# Patient Record
Sex: Female | Born: 1995 | Race: Black or African American | Hispanic: No | Marital: Single | Smoking: Never smoker
Health system: Southern US, Community
[De-identification: ages and names within clinical notes are randomized; demographics above are authoritative.]

## PROBLEM LIST (undated history)

## (undated) DIAGNOSIS — Z9289 Personal history of other medical treatment: Secondary | ICD-10-CM

## (undated) HISTORY — PX: COLONOSCOPY: SHX174

## (undated) HISTORY — PX: UPPER GI ENDOSCOPY: SHX6162

---
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
## 2016-01-16 ENCOUNTER — Encounter: Payer: Self-pay | Admitting: Internal Medicine

## 2016-01-16 ENCOUNTER — Other Ambulatory Visit (INDEPENDENT_AMBULATORY_CARE_PROVIDER_SITE_OTHER): Payer: BLUE CROSS/BLUE SHIELD

## 2016-01-16 ENCOUNTER — Ambulatory Visit (INDEPENDENT_AMBULATORY_CARE_PROVIDER_SITE_OTHER): Payer: BLUE CROSS/BLUE SHIELD | Admitting: Internal Medicine

## 2016-01-16 VITALS — BP 108/68 | HR 68 | Temp 98.4°F | Resp 12 | Ht 68.0 in | Wt 172.0 lb

## 2016-01-16 DIAGNOSIS — R946 Abnormal results of thyroid function studies: Secondary | ICD-10-CM

## 2016-01-16 DIAGNOSIS — R7989 Other specified abnormal findings of blood chemistry: Secondary | ICD-10-CM | POA: Insufficient documentation

## 2016-01-16 LAB — T4, FREE: FREE T4: 0.72 ng/dL (ref 0.60–1.60)

## 2016-01-16 LAB — T3, FREE: T3 FREE: 3.8 pg/mL (ref 2.3–4.2)

## 2016-01-16 LAB — TSH: TSH: 0.52 u[IU]/mL (ref 0.40–5.00)

## 2016-01-16 NOTE — Progress Notes (Signed)
Patient ID: Christina Andrews, female   DOB: 08-28-1996, 20 y.o.   MRN: [id number]   HPI  Christina Andrews is a 20 y.o.-year-old female, referred by Dr. Loreta Ave in consultation for a low TSH. She is here with her cousin.  I reviewed pt's thyroid tests:  12/26/2015: TSH 0.394 (0.45-4.5) No previous tests available. Patient is from [place], studying here.  Pt denies feeling nodules in neck, hoarseness, dysphagia/odynophagia, SOB with lying down; she c/o: - fatigue - + weight gain 05/2015 >> 12/2015 (training harder at the gym >> gained muscle) - + excessive sweating - no tremors - no anxiety - + palpitations - at rest - Not new - no hyperdefecation - no hair loss  Pt does not have a FH of thyroid ds. No FH of thyroid cancer. No h/o radiation tx to head or neck.  No seaweed or kelp, + recent contrast studies - 0328/2017. No steroid use. No herbal supplements. No Biotin use. No narcotics recently. No URI. No chances for pregnancy.  She also has AP and nausea >> seen by Dr Loreta Ave. She also had constipation and BRBPR and bloody emesis >> resolved.  ROS: Constitutional: no weight gain/loss, + fatigue, no subjective hyperthermia/hypothermia Eyes: no blurry vision, no xerophthalmia ENT: no sore throat, no nodules palpated in throat, no dysphagia/odynophagia, no hoarseness Cardiovascular: no CP/SOB/palpitations/leg swelling Respiratory: no cough/SOB Gastrointestinal: no N/V/D/C Musculoskeletal: no muscle/joint aches Skin: no rashes Neurological: no tremors/numbness/tingling/dizziness Psychiatric: no depression/anxiety  Past Medical History  Diagnosis Date  . Hx of HIDA scan    Past Surgical History  Procedure Laterality Date  . Upper gi endoscopy     Social History   Social History  . Marital Status: Single    Spouse Name: N/A  . Number of Children: 0   Occupational History  . student   Social History Main Topics  . Smoking status: Never Smoker   . Smokeless tobacco:  Never Used  . Alcohol Use: No  . Drug Use: No   Current Outpatient Prescriptions on File Prior to Visit  Medication Sig Dispense Refill  . ibuprofen (ADVIL,MOTRIN) 200 MG tablet Take 800 mg by mouth every 6 (six) hours as needed for headache, mild pain or moderate pain.     . Multiple Vitamins-Minerals (MULTIVITAMIN GUMMIES ADULTS PO) Take 1 tablet by mouth daily.    . ondansetron (ZOFRAN) 4 MG tablet Take 1 tablet (4 mg total) by mouth every 6 (six) hours. 12 tablet 0  . oxyCODONE-acetaminophen (PERCOCET/ROXICET) 5-325 MG tablet TAKE 1 TO 2 TABLETS BY MOUTH 2 TO 3 TIMES DAILY FOR SEVERE PAIN  0   No current facility-administered medications on file prior to visit.   Allergies  Allergen Reactions  . Cantaloupe (Diagnostic) Hives and Swelling    Allergic response to all melons, this event was cantaloupe and honeydew   Family History  Problem Relation Age of Onset  . Hyperlipidemia Father   + see HPI  PE: BP 108/68 mmHg  Pulse 68  Temp(Src) 98.4 F (36.9 C) (Oral)  Resp 12  Ht  (1.727 m)  Wt 172 lb (78.019 kg)  BMI 26.16 kg/m2  SpO2 98% Wt Readings from Last 3 Encounters:  01/16/16 172 lb (78.019 kg) (93 %*, Z = 1.45)   * Growth percentiles are based on CDC 2-20 Years data.   Constitutional: normal weight, appears muscular, in NAD Eyes: PERRLA, EOMI, no exophthalmos, no lid lag, no stare ENT: moist mucous membranes, no thyromegaly, no thyroid bruits, no cervical lymphadenopathy  Cardiovascular: RRR, No MRG Respiratory: CTA B Gastrointestinal: abdomen soft, NT, ND, BS+ Musculoskeletal: no deformities, strength intact in all 4 Skin: moist, warm, no rashes Neurological: no tremor with outstretched hands, DTR normal in all 4  ASSESSMENT: 1.Low TSH   PLAN:  1. Patient with a recently found low TSH, without thyrotoxic sxs: no weight loss, tremors, tachycardia, insomnia, hyperdefecation, anxiety. He does have some heat intolerance and palpitations, which are not new -  she does not appear to have exogenous causes for the low TSH, except a CT scan with contrast obtained ~ 2 weeks before the lab check - We discussed that possible causes of thyrotoxicosis are:  Graves ds   Thyroiditis toxic multinodular goiter/ toxic adenoma (I cannot feel nodules at palpation of her thyroid). - I suggested that we check the TSH, fT3 and fT4 and also add thyroid stimulating antibodies to screen for Graves' disease.  - If the tests remain abnormal, we may need an uptake and scan to differentiate between the 3 above possible etiologies  - we discussed about possible modalities of treatment for the above conditions, to include methimazole use, radioactive iodine ablation or (last resort) surgery. - I do not feel that we need to add beta blockers at this time, since she is not tachycardic, anxious, or tremulous - I advised her to join my chart to communicate easier - RTC if labs return abnormal  Appointment on 01/16/2016  Component Date Value Ref Range Status  . T3, Free 01/16/2016 3.8  2.3 - 4.2 pg/mL Final  . Free T4 01/16/2016 0.72  0.60 - 1.60 ng/dL Final  . TSH 45/40/981105/09/2015 0.52  0.40 - 5.00 uIU/mL Final   TFTs normal. Possibly 2/2 high iodine load from the contrasted CT scan vs. Mild episode of thyroiditis, now resolved. TSI antibodies are still pending.

## 2016-01-16 NOTE — Patient Instructions (Addendum)
Please stop at [facility] office for labs.  If they are still abnormal, we may need an uptake and scan test.  We will schedule another appt if the labs are abnormal.

## 2016-01-19 LAB — THYROID STIMULATING IMMUNOGLOBULIN: TSI: 103 %{baseline} (ref <140)

## 2016-06-06 ENCOUNTER — Other Ambulatory Visit (HOSPITAL_COMMUNITY): Payer: Self-pay | Admitting: Orthopedic Surgery

## 2016-06-06 ENCOUNTER — Ambulatory Visit (HOSPITAL_COMMUNITY)
Admission: RE | Admit: 2016-06-06 | Discharge: 2016-06-06 | Disposition: A | Discharge status: Home / Self Care | Payer: BLUE CROSS/BLUE SHIELD | Source: Ambulatory Visit | Attending: Orthopedic Surgery | Admitting: Orthopedic Surgery

## 2016-06-06 DIAGNOSIS — M7989 Other specified soft tissue disorders: Secondary | ICD-10-CM | POA: Diagnosis not present

## 2016-06-06 DIAGNOSIS — M79644 Pain in right finger(s): Secondary | ICD-10-CM

## 2016-06-06 MED ORDER — GADOBENATE DIMEGLUMINE 529 MG/ML IV SOLN
15.0000 mL | Freq: Once | INTRAVENOUS | Status: AC | PRN
Start: 2016-06-06 — End: 2016-06-06
  Administered 2016-06-06: 15 mL via INTRAVENOUS

## 2016-06-11 ENCOUNTER — Encounter (HOSPITAL_COMMUNITY): Payer: Self-pay | Admitting: Emergency Medicine

## 2016-06-11 ENCOUNTER — Emergency Department (HOSPITAL_COMMUNITY)
Admission: EM | Admit: 2016-06-11 | Discharge: 2016-06-11 | Disposition: A | Discharge status: Home / Self Care | Payer: BLUE CROSS/BLUE SHIELD | Attending: Emergency Medicine | Admitting: Emergency Medicine

## 2016-06-11 DIAGNOSIS — F419 Anxiety disorder, unspecified: Secondary | ICD-10-CM | POA: Insufficient documentation

## 2016-06-11 DIAGNOSIS — T404X5A Adverse effect of other synthetic narcotics, initial encounter: Secondary | ICD-10-CM | POA: Diagnosis not present

## 2016-06-11 DIAGNOSIS — Z79899 Other long term (current) drug therapy: Secondary | ICD-10-CM | POA: Insufficient documentation

## 2016-06-11 DIAGNOSIS — T50905A Adverse effect of unspecified drugs, medicaments and biological substances, initial encounter: Secondary | ICD-10-CM

## 2016-06-11 MED ORDER — DIPHENHYDRAMINE HCL 25 MG PO CAPS
25.0000 mg | ORAL_CAPSULE | Freq: Once | ORAL | Status: AC
  Administered 2016-06-11: 25 mg via ORAL
  Filled 2016-06-11: qty 1

## 2016-06-11 NOTE — Discharge Instructions (Signed)
Take ibuprofen for pain as needed. STOP TAKING ULTRAM (TRAMADOL). Follow up with your doctor as per schedule. Return to the emergency department with any worsening symptoms or new concerns.

## 2016-06-11 NOTE — ED Triage Notes (Signed)
Pt from home with "jitteryness" to tramadol that she took tonight at [employer]. Pt is taking the tramadol for pain secondary to a "procedure" she had done to her right pointer finger for infection. Finger is bandaged. Pt is not febrile nor tachycardic.

## 2016-06-11 NOTE — ED Provider Notes (Signed)
[facility]-EMERGENCY DEPT Provider Note   CSN: [id number] Arrival date & time: 06/11/16  [id number]  By signing my name below, I, Christina Andrews, attest that this documentation has been prepared under the direction and in the presence of Destry Dauber A Fantasha Daniele PA-C,. Electronically Signed: Sonum Andrews, Neurosurgeoncribe. 06/11/16. 3:16 AM.  History   Chief Complaint Chief Complaint  Patient presents with  . Medication Reaction    The history is provided by the patient. No language interpreter was used.     HPI Comments: Christina Andrews is a 20 y.o. female who presents to the Emergency Department complaining of a medication reaction that occurred about 2.5 hours ago. Patient states she has been taking Tramadol for a finger procedure for the past 5 days without difficulty. She reports taking most recent dose at 12:30AM today and began feeling anxious/jumpy and diaphoretic 20 minutes later. She states this has gradually improved over the last couple of hours but complains of mild SOB. She has not taken Benadryl for her symptoms. She denies tongue swelling, lip swelling, difficulty swallowing, rash.   Past Medical History:  Diagnosis Date  . Hx of HIDA scan     Patient Active Problem List   Diagnosis Date Noted  . Low serum thyroid stimulating hormone (TSH) 01/16/2016    Past Surgical History:  Procedure Laterality Date  . COLONOSCOPY    . UPPER GI ENDOSCOPY      OB History    No data available       Home Medications    Prior to Admission medications   Medication Sig Start Date End Date Taking? Authorizing Provider  ibuprofen (ADVIL,MOTRIN) 200 MG tablet Take 800 mg by mouth every 6 (six) hours as needed for headache, mild pain or moderate pain.     Historical Provider, MD  Multiple Vitamins-Minerals (MULTIVITAMIN GUMMIES ADULTS PO) Take 1 tablet by mouth daily.    Historical Provider, MD  ondansetron (ZOFRAN) 4 MG tablet Take 1 tablet (4 mg total) by mouth every 6 (six) hours. 12/13/15   Emi HolesAlexandra M  Law, PA-C    Family History Family History  Problem Relation Age of Onset  . Hyperlipidemia Father     Social History Social History  Substance Use Topics  . Smoking status: Never Smoker  . Smokeless tobacco: Never Used  . Alcohol use No     Allergies   Cantaloupe (diagnostic)   Review of Systems Review of Systems  Constitutional: Positive for diaphoresis.  HENT: Negative for facial swelling and trouble swallowing.   Respiratory: Negative for shortness of breath.   Cardiovascular: Negative for chest pain.  Gastrointestinal: Negative for nausea.  Skin: Negative for rash.  Psychiatric/Behavioral: The patient is nervous/anxious.      Physical Exam Updated Vital Signs BP 127/80 (BP Location: Left Arm)   Pulse 79   Temp 98.3 F (36.8 C) (Oral)   Resp 16   Ht 5\' 9"  (1.753 m)   Wt 155 lb (70.3 kg)   SpO2 98%   BMI 22.89 kg/m   Physical Exam  Constitutional: She is oriented to person, place, and time. She appears well-developed and well-nourished.  HENT:  Head: Normocephalic and atraumatic.  Mouth/Throat: Oropharynx is clear and moist.  No lip or tongue swelling  Cardiovascular: Normal rate.   Pulmonary/Chest: Effort normal and breath sounds normal. No stridor. No respiratory distress. She has no wheezes. She has no rales.  Full breath sounds in all fields  Neurological: She is alert and oriented to person, place, and time.  Skin: Skin is warm and dry.  Psychiatric: She has a normal mood and affect.  Nursing note and vitals reviewed.    ED Treatments / Results  DIAGNOSTIC STUDIES: Oxygen Saturation is 98% on RA, normal by my interpretation.    COORDINATION OF CARE: 3:14 AM Discussed treatment plan with pt at bedside and pt agreed to plan.   Labs (all labs ordered are listed, but only abnormal results are displayed) Labs Reviewed - No data to display  EKG  EKG Interpretation None       Radiology No results found.  Procedures Procedures  (including critical care time)  Medications Ordered in ED Medications - No data to display   Initial Impression / Assessment and Plan / ED Course  I have reviewed the triage vital signs and the nursing notes.  Pertinent labs & imaging results that were available during my care of the patient were reviewed by me and considered in my medical decision making (see chart for details).  Clinical Course    Patient with symptoms of nervousness, "jumpy", restless, and diaphoretic 20 minutes after taking it tonight. She is feeling better now but is not symptom-free.   Do not feel there is any sign of anaphylaxis. Recommended not taking any further Tramodol and will switch to another medication. She declines other medication and will take ibuprofen. She is felt stable for discharge home.   Final Clinical Impressions(s) / ED Diagnoses   Final diagnoses:  None   1. Adverse drug reaction  New Prescriptions New Prescriptions   No medications on file    I personally performed the services described in this documentation, which was scribed in my presence. The recorded information has been reviewed and is accurate.      Elpidio Anis, PA-C 06/20/16 1610    Zadie Rhine, MD 06/20/16 [phone]

## 2017-07-22 IMAGING — NM NM HEPATO W/GB/PHARM/[PERSON_NAME]
2 series · 12 of 12 positions shown · non-contrast
Comparison: Abdominal ultrasound dated 06/17/2015

CLINICAL DATA: Abdominal pain x3 weeks, nausea/ vomiting.

EXAM:
NUCLEAR MEDICINE HEPATOBILIARY IMAGING WITH GALLBLADDER EF
TECHNIQUE: Sequential images of the abdomen were obtained [DATE] minutes
following intravenous administration of radiopharmaceutical. After
slow intravenous infusion of 1.39 micrograms Cholecystokinin,
gallbladder ejection fraction was determined.
RADIOPHARMACEUTICALS:  5.32 mCi Bc-LLm Choletec IV

[Series 1: biliary · 4.14mm/px · 6 of 46 frames shown]
[frame 4/46]
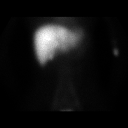
[frame 12/46]
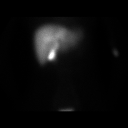
[frame 20/46]
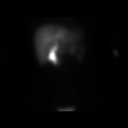
[frame 27/46]
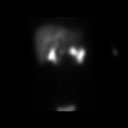
[frame 35/46]
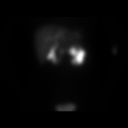
[frame 43/46]
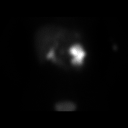

[Series 2: gbef · 4.14mm/px · 6 of 60 frames shown]
[frame 6/60]
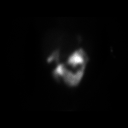
[frame 16/60]
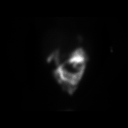
[frame 26/60]
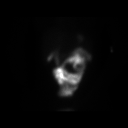
[frame 36/60]
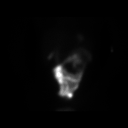
[frame 46/60]
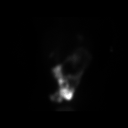
[frame 56/60]
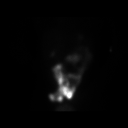

[12 of 12 positions shown; findings below may reference images not displayed]

FINDINGS: Normal hepatic excretion.

Gallbladder is visualized within 5 minutes.

Small bowel is visualized within 20 minutes.

Gallbladder ejection fraction of 97%

At 45 min, normal ejection fraction is greater than 40%.
IMPRESSION: Normal hepatobiliary nuclear medicine scan.

Normal gallbladder ejection fraction.

## 2017-09-13 IMAGING — US US ABDOMEN COMPLETE
1 series · 14 of 25 positions shown · non-contrast
Comparison: None.

CLINICAL DATA: Nausea.

EXAM:
ULTRASOUND ABDOMEN COMPLETE

[Series 1: us abdomen complete · 0.20mm/px · 14 of 67 slices shown]
[im 1/67]
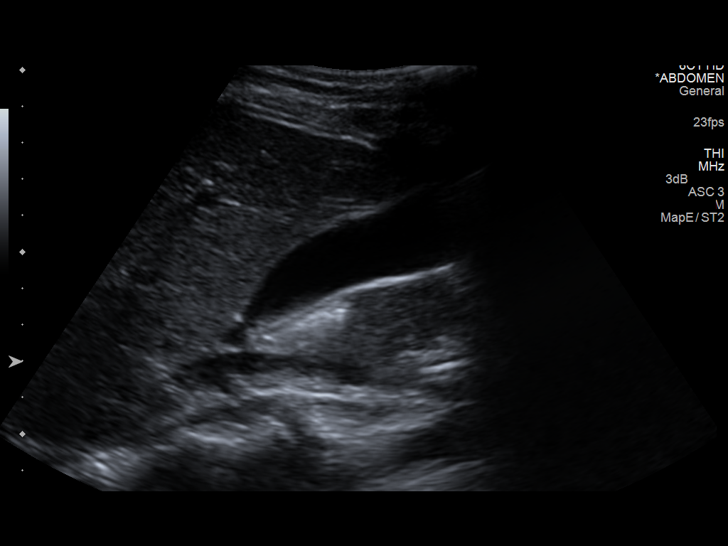
[im 6/67]
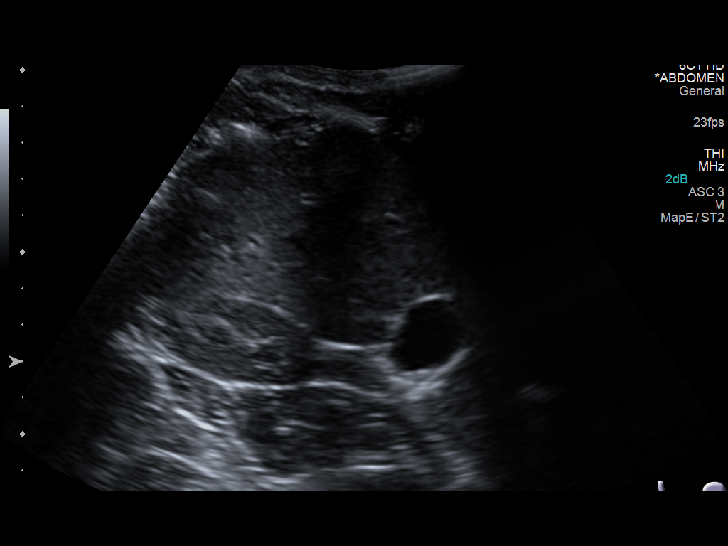
[im 12/67]
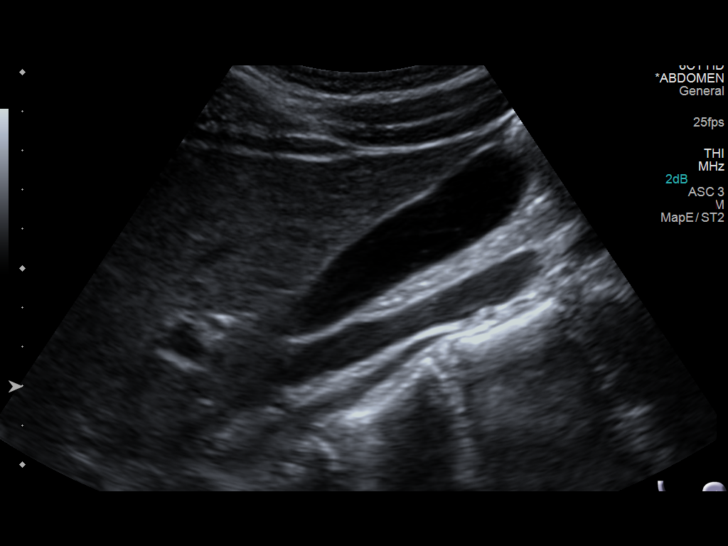
[im 17/67]
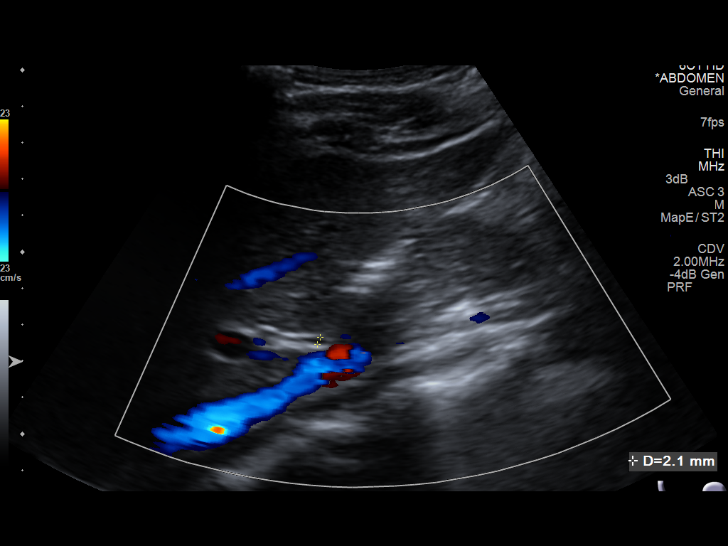
[im 23/67]
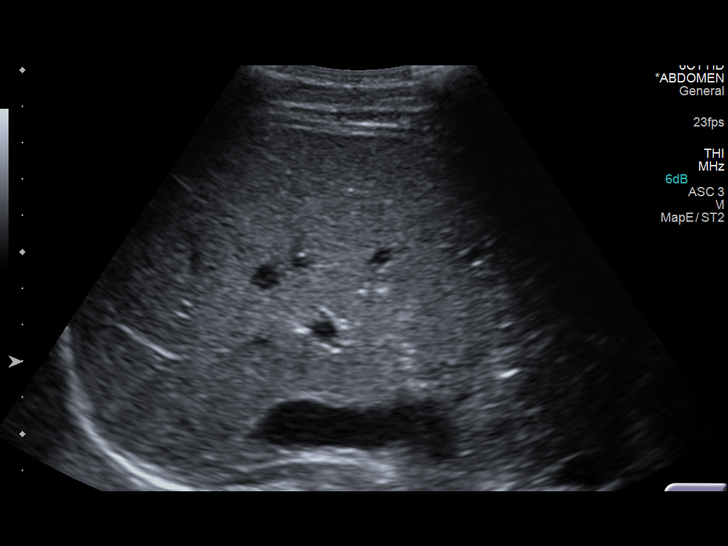
[im 25/67]
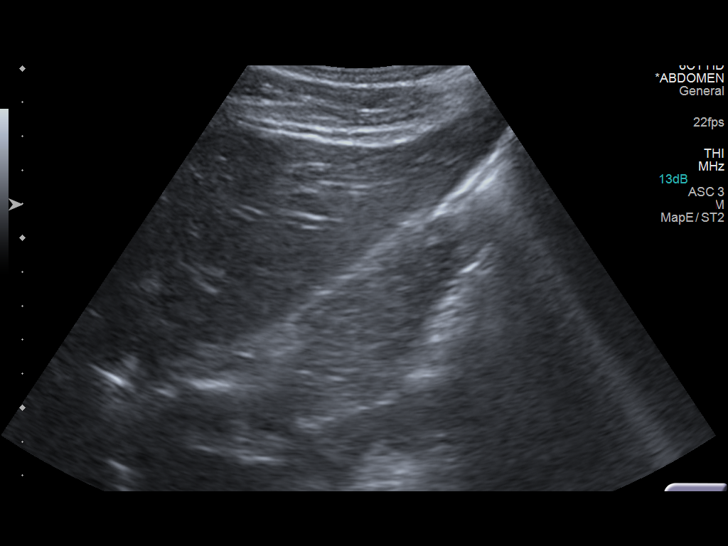
[im 31/67]
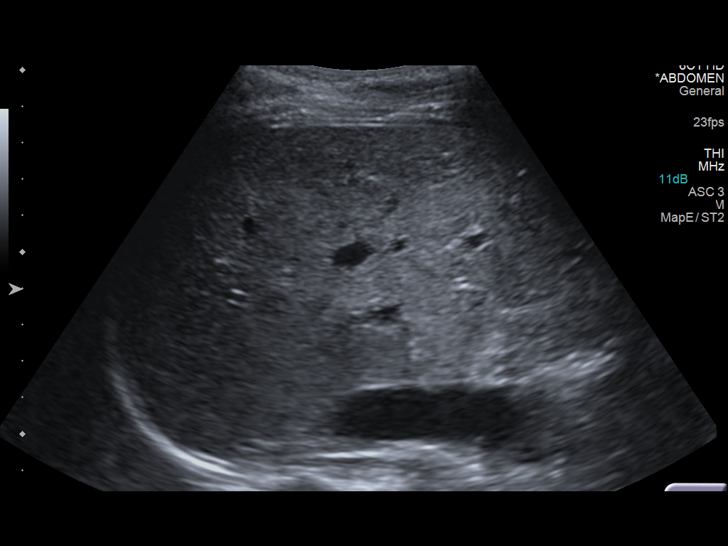
[im 36/67]
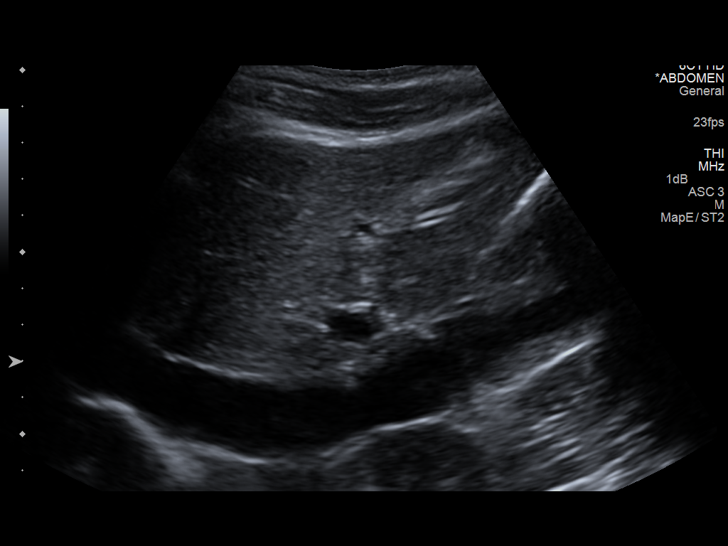
[im 42/67]
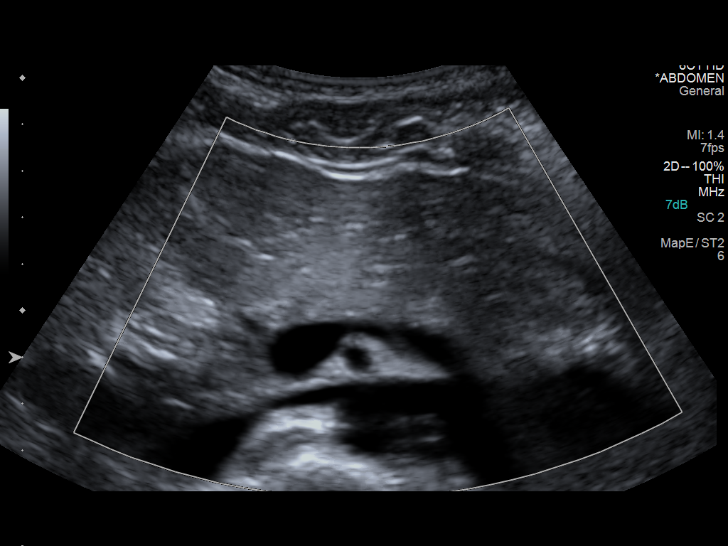
[im 45/67]
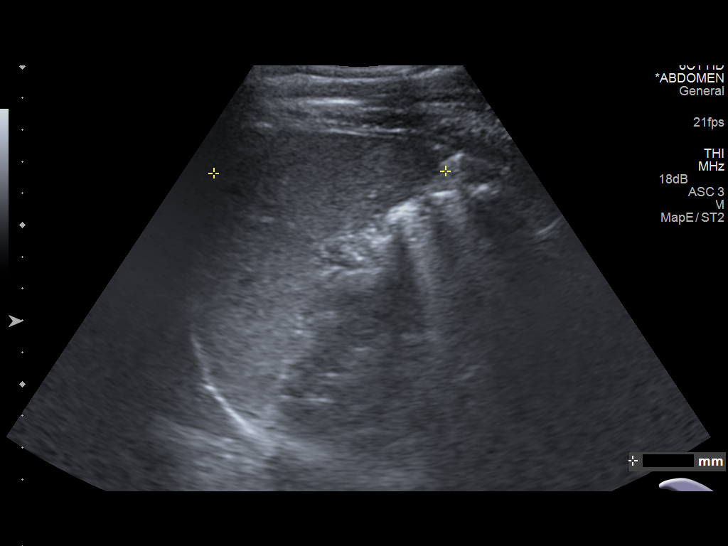
[im 50/67]
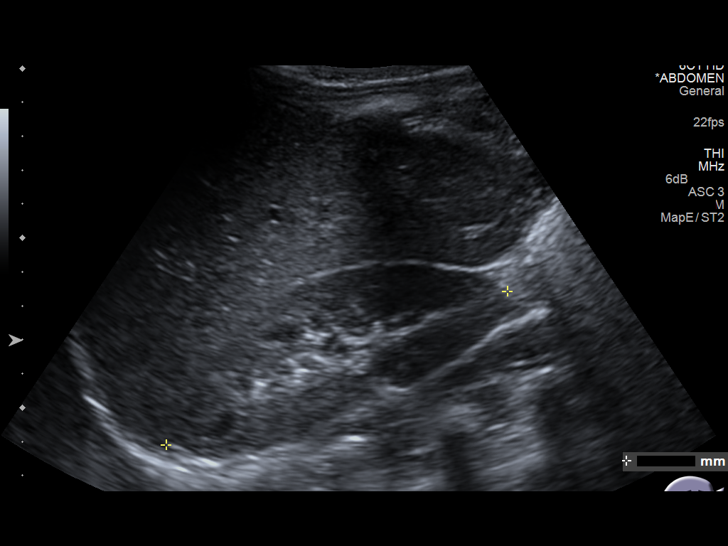
[im 56/67]
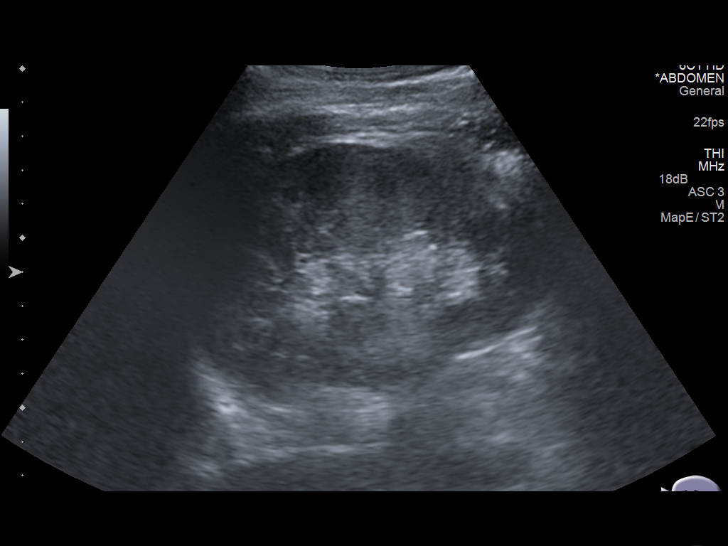
[im 61/67]
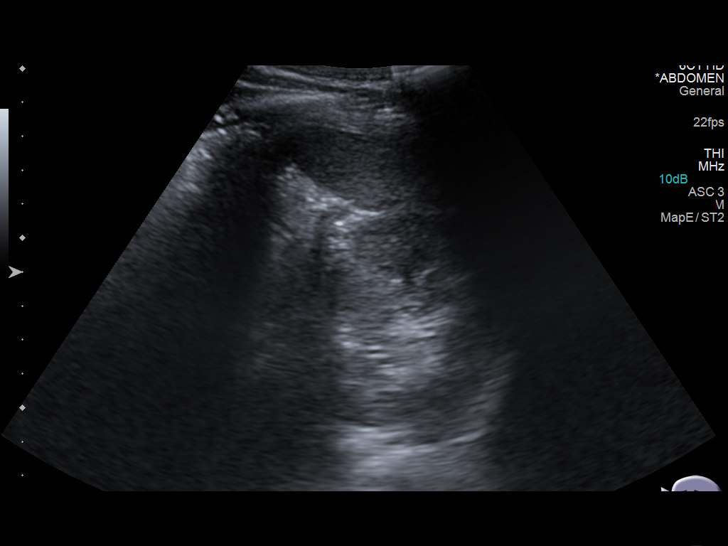
[im 67/67]
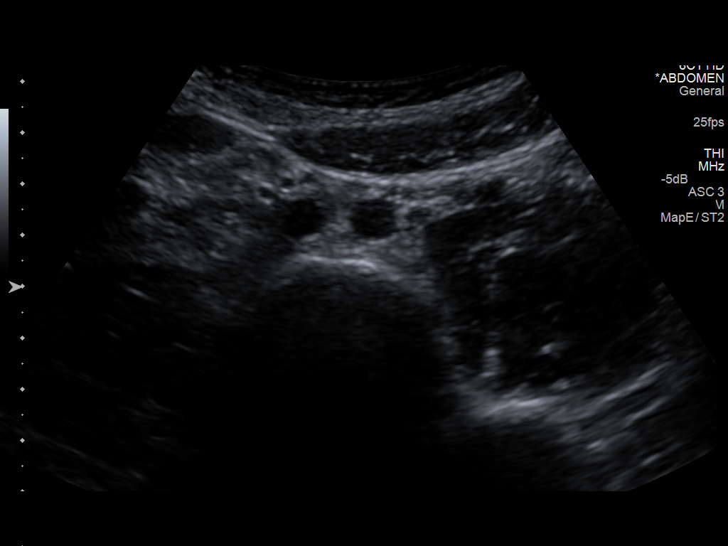

[14 of 25 positions shown; findings below may reference images not displayed]

FINDINGS: Gallbladder: No gallstones or wall thickening visualized. No
sonographic Murphy sign noted.

Common bile duct: Diameter: 2.1 mm, normal.

Liver: No focal lesion identified. Within normal limits in
parenchymal echogenicity.

IVC: No abnormality visualized.

Pancreas: Visualized portion unremarkable.

Spleen: Size and appearance within normal limits.  7.3 cm in length.

Right Kidney: Length: 11.0 cm. Echogenicity within normal limits. No
mass or hydronephrosis visualized.

Left Kidney: Length: 11.7 cm. Echogenicity within normal limits. No
mass or hydronephrosis visualized.

Abdominal aorta: No aneurysm visualized.  1.9 cm maximal diameter.

Other findings: None.
IMPRESSION: Normal exam.
